# Patient Record
Sex: Male | Born: 1945 | Race: White | Hispanic: No | Marital: Married | Smoking: Current every day smoker
Health system: Southern US, Community
[De-identification: ages and names within clinical notes are randomized; demographics above are authoritative.]

## PROBLEM LIST (undated history)

## (undated) DIAGNOSIS — I219 Acute myocardial infarction, unspecified: Secondary | ICD-10-CM

## (undated) HISTORY — PX: BACK SURGERY: SHX140

## (undated) HISTORY — DX: Acute myocardial infarction, unspecified: I21.9

---

## 2018-01-30 ENCOUNTER — Encounter: Payer: Self-pay | Admitting: Internal Medicine

## 2018-01-30 ENCOUNTER — Telehealth: Payer: Self-pay | Admitting: Internal Medicine

## 2018-01-30 NOTE — Telephone Encounter (Signed)
Pt has been referred to our office for a 2nd opinion. Pt has been scheduled to see Dr. Julien Nordmann on 11/12 at 215pm. Pt is aware to arrive 30 minutes early for labs at 145pm. Letter and directions mailed to the pt.

## 2018-02-17 ENCOUNTER — Inpatient Hospital Stay: Payer: Medicare Other | Attending: Internal Medicine | Admitting: Internal Medicine

## 2018-02-17 ENCOUNTER — Other Ambulatory Visit: Payer: Self-pay | Admitting: Medical Oncology

## 2018-02-17 ENCOUNTER — Encounter: Payer: Self-pay | Admitting: Internal Medicine

## 2018-02-17 ENCOUNTER — Telehealth: Payer: Self-pay | Admitting: Internal Medicine

## 2018-02-17 ENCOUNTER — Inpatient Hospital Stay: Payer: Medicare Other

## 2018-02-17 DIAGNOSIS — Z923 Personal history of irradiation: Secondary | ICD-10-CM | POA: Insufficient documentation

## 2018-02-17 DIAGNOSIS — C3490 Malignant neoplasm of unspecified part of unspecified bronchus or lung: Secondary | ICD-10-CM

## 2018-02-17 DIAGNOSIS — R64 Cachexia: Secondary | ICD-10-CM

## 2018-02-17 DIAGNOSIS — Z85118 Personal history of other malignant neoplasm of bronchus and lung: Secondary | ICD-10-CM | POA: Insufficient documentation

## 2018-02-17 DIAGNOSIS — R918 Other nonspecific abnormal finding of lung field: Secondary | ICD-10-CM

## 2018-02-17 LAB — CBC WITH DIFFERENTIAL (CANCER CENTER ONLY)
Abs Immature Granulocytes: 0.03 10*3/uL (ref 0.00–0.07)
BASOS ABS: 0 10*3/uL (ref 0.0–0.1)
Basophils Relative: 1 %
Eosinophils Absolute: 0.1 10*3/uL (ref 0.0–0.5)
Eosinophils Relative: 1 %
HEMATOCRIT: 39.4 % (ref 39.0–52.0)
HEMOGLOBIN: 13.2 g/dL (ref 13.0–17.0)
IMMATURE GRANULOCYTES: 0 %
LYMPHS ABS: 2.1 10*3/uL (ref 0.7–4.0)
LYMPHS PCT: 23 %
MCH: 30.3 pg (ref 26.0–34.0)
MCHC: 33.5 g/dL (ref 30.0–36.0)
MCV: 90.6 fL (ref 80.0–100.0)
Monocytes Absolute: 0.8 10*3/uL (ref 0.1–1.0)
Monocytes Relative: 9 %
NEUTROS PCT: 66 %
NRBC: 0 % (ref 0.0–0.2)
Neutro Abs: 5.8 10*3/uL (ref 1.7–7.7)
Platelet Count: 173 10*3/uL (ref 150–400)
RBC: 4.35 MIL/uL (ref 4.22–5.81)
RDW: 12 % (ref 11.5–15.5)
WBC Count: 8.8 10*3/uL (ref 4.0–10.5)

## 2018-02-17 LAB — CMP (CANCER CENTER ONLY)
ALBUMIN: 3.2 g/dL — AB (ref 3.5–5.0)
ALK PHOS: 96 U/L (ref 38–126)
ALT: 10 U/L (ref 0–44)
AST: 14 U/L — AB (ref 15–41)
Anion gap: 6 (ref 5–15)
BUN: 11 mg/dL (ref 8–23)
CO2: 29 mmol/L (ref 22–32)
Calcium: 8.8 mg/dL — ABNORMAL LOW (ref 8.9–10.3)
Chloride: 104 mmol/L (ref 98–111)
Creatinine: 0.89 mg/dL (ref 0.61–1.24)
GFR, Est AFR Am: >60 mL/min (ref >60)
GFR, Estimated: >60 mL/min (ref >60)
GLUCOSE: 100 mg/dL — AB (ref 70–99)
POTASSIUM: 4.1 mmol/L (ref 3.5–5.1)
SODIUM: 139 mmol/L (ref 135–145)
Total Bilirubin: 0.4 mg/dL (ref 0.3–1.2)
Total Protein: 6.9 g/dL (ref 6.5–8.1)

## 2018-02-17 NOTE — Progress Notes (Signed)
[place] [phone]   [phone]  CONSULT NOTE  REFERRING PHYSICIAN: Dr. Thea Silversmith  REASON FOR CONSULTATION:  72 years old white male with suspicious metastatic lung cancer  HPI Charles Horne is a 72 y.o. male with past medical history significant for coronary artery disease status post myocardial infarction, history of back surgery, history of arm and knee surgery.  The patient also has a long history for smoking for over 60 years and still smoking.  He mentioned that he was seen by his primary care physician in 2016 and he underwent CT screening of the chest on Sep 01, 2014 and that showed a left upper lobe/apical pulmonary nodule that is spiculated and highly suspicious for primary bronchogenic carcinoma.  The patient also had severe centrilobular emphysema.  A PET scan on 09/14/2014 showed the solitary left upper lobe nodule concerning for malignancy.  He was seen by radiation oncology and a biopsy was not performed for concern about pneumothorax because of the significant emphysema.  He underwent stereotactic radiotherapy to the left upper lobe lung nodule under the care of Dr. Quitman Livings.  Repeat imaging studies after the procedure showed persistent the scarring and the left apical area.  The patient was followed by observation and repeat imaging studies for the last 3 years.  He was seen recently by Dr. Wilburt Finlay worse for routine follow-up visit with repeat CT scan of the chest on 12/18/2017 and that showed numerous greater than 20 new subcentimeter solid pulmonary nodules are scattered throughout both lungs and most compatible with pulmonary metastasis.  There was also stable masslike radiation fibrosis in the apical left upper lobe with no thoracic adenopathy.  Dr. Pablo Ledger discussed with the patient some of his treatment options including palliative care and hospice referral since the patient does not have any tissue diagnosis confirmed.  The  patient requested a second opinion and he was referred to me today for evaluation and recommendation regarding his condition. When seen today he is feeling fine with no concerning complaints.  He denied having any chest pain, shortness breath, cough or hemoptysis.  He denied having any recent weight loss or night sweats.  He has no nausea, vomiting, diarrhea or constipation.  He denied having any headache or visual changes. Family history significant for mother with heart disease, father died from pneumonia, 2 sisters with breast cancer. The patient is married and has 4 children.  He was accompanied by his wife Charles Horne.  The patient has a history of his smoking more than 1 pack/day for over 60 years and unfortunately he continues to smoke and I strongly encouraged him to quit smoking.  He also has a history of alcohol abuse in the past but not in the last few years.  He denied having any history of drug abuse. HPI  Past Medical History:  Diagnosis Date  . Myocardial infarction [facility])     Past Surgical History:  Procedure Laterality Date  . BACK SURGERY      Family History  Problem Relation Age of Onset  . Coronary artery disease Mother   . Pneumonia Father   . Breast cancer Sister     Social History Social History   Tobacco Use  . Smoking status: Current Every Day Smoker    Packs/day: 1.50    Years: 60.00    Pack years: 90.00    Types: Cigarettes  . Smokeless tobacco: Never Used  Substance Use Topics  . Alcohol use: Not Currently  .  Drug use: Never    Allergies  Allergen Reactions  . Demerol [Meperidine Hcl]     No current outpatient medications on file.   No current facility-administered medications for this visit.     Review of Systems  Constitutional: negative Eyes: negative Ears, nose, mouth, throat, and face: negative Respiratory: negative Cardiovascular: negative Gastrointestinal: negative Genitourinary:negative Integument/breast:  negative Hematologic/lymphatic: negative Musculoskeletal:negative Neurological: negative Behavioral/Psych: negative Endocrine: negative Allergic/Immunologic: negative  Physical Exam  BWG:YKZLD, healthy, no distress, well nourished and well developed SKIN: skin color, texture, turgor are normal, no rashes or significant lesions HEAD: Normocephalic, No masses, lesions, tenderness or abnormalities EYES: normal, PERRLA, Conjunctiva are pink and non-injected EARS: External ears normal, Canals clear OROPHARYNX:no exudate, no erythema and lips, buccal mucosa, and tongue normal  NECK: supple, no adenopathy, no JVD LYMPH:  no palpable lymphadenopathy, no hepatosplenomegaly LUNGS: clear to auscultation , and palpation HEART: regular rate & rhythm, no murmurs and no gallops ABDOMEN:abdomen soft, non-tender, normal bowel sounds and no masses or organomegaly BACK: Back symmetric, no curvature., No CVA tenderness EXTREMITIES:no joint deformities, effusion, or inflammation, no edema  NEURO: alert & oriented x 3 with fluent speech, no focal motor/sensory deficits  PERFORMANCE STATUS: ECOG 1  LABORATORY DATA: Lab Results  Component Value Date   WBC 8.8 02/17/2018   HGB 13.2 02/17/2018   HCT 39.4 02/17/2018   MCV 90.6 02/17/2018   PLT 173 02/17/2018      Chemistry   No results found for: NA, K, CL, CO2, BUN, CREATININE, GLU No results found for: CALCIUM, ALKPHOS, AST, ALT, BILITOT     RADIOGRAPHIC STUDIES: No results found.  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 He has been in observation for the last several years but the recent CT scan of the chest showed numerous bilateral pulmonary nodules suspicious for metastatic disease.  PLAN: I had a lengthy discussion with the patient and  his wife today about his current disease stage, prognosis and treatment options. I personally and independently reviewed the scan images and discussed the result and showed the images to the patient and his wife. Unfortunately the patient has no tissue diagnosis so far.  There was some concern about proceeding with CT-guided core biopsy for fear of the pneumothorax because of his significant emphysema. I recommended for the patient to have his case discussed at the weekly thoracic conference at [facility] health to find the best approach to do a biopsy for this patient either with CT-guided core biopsy of 1 of the peripheral pulmonary nodules versus consideration of bronchoscopy with navigation bronchoscopy if needed. The patient is scheduled to have repeat CT scan of the chest by Dr. Pablo Ledger next week and I will also wait for the results of this scan to find out if the patient has any other sites for tissue biopsy. I will arrange for the patient to come back for follow-up visit in 3 weeks for evaluation and more detailed discussion of his condition and best approach for biopsy as well as treatment options. The patient was strongly advised to quit smoking. He was also advised to call immediately if he has any concerning symptoms in the interval. The patient voices understanding of current disease status and treatment options and is in agreement with the current care plan.  All questions were answered. The patient  knows to call the clinic with any problems, questions or concerns. We can certainly see the patient much sooner if necessary.  Thank you so much for allowing me to participate in the care of Charles Horne. I will continue to follow up the patient with you and assist in his care.  I spent 40 minutes counseling the patient face to face. The total time spent in the appointment was 60 minutes.  Disclaimer: This note was dictated with voice recognition software. Similar sounding words can  inadvertently be transcribed and may not be corrected upon review.   Eilleen Kempf February 17, 2018, 2:30 PM

## 2018-02-17 NOTE — Telephone Encounter (Signed)
Scheduled appt per 11/12 los- gave patient AVS and calender per los.   

## 2018-02-20 ENCOUNTER — Telehealth: Payer: Self-pay | Admitting: Internal Medicine

## 2018-02-20 NOTE — Telephone Encounter (Signed)
Called about 11/25

## 2018-02-26 ENCOUNTER — Other Ambulatory Visit: Payer: Self-pay | Admitting: *Deleted

## 2018-02-26 NOTE — Progress Notes (Signed)
Cancer conference 02/26/18 discussion documented for communication purpose. Patient was not present nor seen by physician.

## 2018-03-02 ENCOUNTER — Encounter: Payer: Medicare Other | Admitting: Nutrition

## 2018-03-10 ENCOUNTER — Encounter: Payer: Self-pay | Admitting: Internal Medicine

## 2018-03-10 ENCOUNTER — Inpatient Hospital Stay: Payer: Medicare Other | Attending: Internal Medicine | Admitting: Internal Medicine

## 2018-03-10 ENCOUNTER — Telehealth: Payer: Self-pay | Admitting: Internal Medicine

## 2018-03-10 VITALS — BP 121/73 | HR 87 | Temp 98.5°F | Resp 17 | Wt 125.3 lb

## 2018-03-10 DIAGNOSIS — Z923 Personal history of irradiation: Secondary | ICD-10-CM | POA: Insufficient documentation

## 2018-03-10 DIAGNOSIS — Z79899 Other long term (current) drug therapy: Secondary | ICD-10-CM | POA: Insufficient documentation

## 2018-03-10 DIAGNOSIS — C3412 Malignant neoplasm of upper lobe, left bronchus or lung: Secondary | ICD-10-CM | POA: Diagnosis present

## 2018-03-10 DIAGNOSIS — R918 Other nonspecific abnormal finding of lung field: Secondary | ICD-10-CM

## 2018-03-10 DIAGNOSIS — C3490 Malignant neoplasm of unspecified part of unspecified bronchus or lung: Secondary | ICD-10-CM

## 2018-03-10 NOTE — Progress Notes (Signed)
[place] [phone]   [phone]  OFFICE PROGRESS NOTE  Myrtis Hopping., MD 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  PRIOR THERAPY:status post stereotactic radiotherapy at that time. He has been in observation for the last several years but the recent CT scan of the chest showed numerous bilateral pulmonary nodules suspicious for metastatic disease.  CURRENT THERAPY: Observation.  INTERVAL HISTORY: Charles Horne 72 y.o. male returns to the clinic today for returns to the clinic today for follow-up visit.  The patient is feeling fine today with no concerning complaints except for low back pain.  He has degenerative disc disease in that area.  He denied having any chest pain, shortness of breath, cough or hemoptysis.  He denied having any fever or chills.  He has no nausea, vomiting, diarrhea or constipation.  He has no weight loss or night sweats.  The patient had repeat CT scan of the chest performed at [facility] hospital yesterday and he is here for evaluation and discussion of his discuss results and recommendation regarding his condition.  MEDICAL HISTORY: Past Medical History:  Diagnosis Date  . Myocardial infarction (HCC)     ALLERGIES:  is allergic to demerol [meperidine hcl].  MEDICATIONS:  Current Outpatient Medications  Medication Sig Dispense Refill  . albuterol (PROVENTIL HFA;VENTOLIN HFA) 108 (90 Base) MCG/ACT inhaler Inhale 2 puffs into the lungs every 4 (four) hours as needed.    . calcium-vitamin D (OSCAL WITH D) 500-200 MG-UNIT TABS tablet Take 1 tablet by mouth daily.    . clonazePAM (KLONOPIN) 1 MG tablet Take 1 tablet by mouth 3 (three) times daily as needed.    . docusate sodium (COLACE) 100 MG capsule Take 1 capsule by mouth daily.    . meclizine  (ANTIVERT) 25 MG tablet Take 1 tablet by mouth as needed.    . metoprolol succinate (TOPROL-XL) 50 MG 24 hr tablet Take 1 tablet by mouth daily.    . mirtazapine (REMERON) 45 MG tablet Take 45 mg by mouth at bedtime.    . nitroGLYCERIN (NITROSTAT) 0.4 MG SL tablet Place under the tongue.    Marland Kitchen omeprazole (PRILOSEC) 40 MG capsule Take 1 capsule by mouth daily.  1  . oxyCODONE-acetaminophen (PERCOCET) 10-325 MG tablet Take 1 tablet by mouth 4 (four) times daily as needed.    . Saw Palmetto 160 MG CAPS Take 1 capsule by mouth daily. 2 in am and 1 hs    . SYMBICORT 160-4.5 MCG/ACT inhaler Inhale 2 puffs into the lungs 2 (two) times daily.  3  . TRELEGY ELLIPTA 100-62.5-25 MCG/INH AEPB Inhale 1 puff into the lungs daily.  3   No current facility-administered medications for this visit.     SURGICAL HISTORY:  Past Surgical History:  Procedure Laterality Date  . BACK SURGERY      REVIEW OF SYSTEMS:  Constitutional: positive for fatigue Eyes: negative Ears, nose, mouth, throat, and face: negative Respiratory: negative Cardiovascular: negative Gastrointestinal: negative Genitourinary:negative Integument/breast: negative Hematologic/lymphatic: negative Musculoskeletal:positive for back pain Neurological: negative Behavioral/Psych: negative Endocrine: negative Allergic/Immunologic: negative   PHYSICAL EXAMINATION: General appearance: alert, cooperative, fatigued and no distress Head: Normocephalic, without obvious abnormality, atraumatic Neck: no adenopathy, no JVD, supple, symmetrical, trachea midline and thyroid not enlarged, symmetric, no tenderness/mass/nodules Lymph nodes: Cervical, supraclavicular, and axillary nodes  normal. Resp: clear to auscultation bilaterally Back: symmetric, no curvature. ROM normal. No CVA tenderness. Cardio: regular rate and rhythm, S1, S2 normal, no murmur, click, rub or gallop GI: soft, non-tender; bowel sounds normal; no masses,  no  organomegaly Extremities: extremities normal, atraumatic, no cyanosis or edema Neurologic: Alert and oriented X 3, normal strength and tone. Normal symmetric reflexes. Normal coordination and gait  ECOG PERFORMANCE STATUS: 1 - Symptomatic but completely ambulatory  Blood pressure 121/73, pulse 87, temperature 98.5 F (36.9 C), temperature source Oral, resp. rate 17, weight 125 lb 4.8 oz (56.8 kg), SpO2 (!) 89 %.  LABORATORY DATA: Lab Results  Component Value Date   WBC 8.8 02/17/2018   HGB 13.2 02/17/2018   HCT 39.4 02/17/2018   MCV 90.6 02/17/2018   PLT 173 02/17/2018      Chemistry      Component Value Date/Time   NA 139 02/17/2018 1349   K 4.1 02/17/2018 1349   CL 104 02/17/2018 1349   CO2 29 02/17/2018 1349   BUN 11 02/17/2018 1349   CREATININE 0.89 02/17/2018 1349      Component Value Date/Time   CALCIUM 8.8 (L) 02/17/2018 1349   ALKPHOS 96 02/17/2018 1349   AST 14 (L) 02/17/2018 1349   ALT 10 02/17/2018 1349   BILITOT 0.4 02/17/2018 1349       RADIOGRAPHIC STUDIES: No results found.  ASSESSMENT AND PLAN: This is a very pleasant 72 years old white male with history of stage IA suspicious lung cancer status post stereotactic radiotherapy to left upper lobe mass at [facility] hospital and has been in observation since that time.  The patient was found to have bilateral pulmonary nodules suspicious for metastatic disease. He had repeat CT scan of the chest performed yesterday that showed mixed with finding with the metastatic pulmonary nodules some are smaller and some are larger but none of them is large enough for consideration of CT-guided core biopsy or bronchoscopy.  The patient has no evidence for mediastinal or hilar adenopathy and no evidence of metastatic disease in the upper abdomen. I personally and independently reviewed the scan images and discussed the result and showed the images to the patient and his wife. I recommended for the patient to  continue on observation for now with repeat CT scan of the chest in 3 months for restaging of his disease and to consider him for a biopsy if there is an accessible nodule for biopsy. The patient was also advised to call immediately if he has any concerning symptoms in the interval. The patient voices understanding of current disease status and treatment options and is in agreement with the current care plan.  All questions were answered. The patient knows to call the clinic with any problems, questions or concerns. We can certainly see the patient much sooner if necessary.  I spent 15 minutes counseling the patient face to face. The total time spent in the appointment was 25 minutes.  Disclaimer: This note was dictated with voice recognition software. Similar sounding words can inadvertently be transcribed and may not be corrected upon review.

## 2018-03-10 NOTE — Telephone Encounter (Signed)
Printed calendar and avs. °

## 2018-06-05 ENCOUNTER — Inpatient Hospital Stay: Payer: Medicare Other | Attending: Internal Medicine

## 2018-06-05 DIAGNOSIS — C3412 Malignant neoplasm of upper lobe, left bronchus or lung: Secondary | ICD-10-CM | POA: Diagnosis not present

## 2018-06-05 DIAGNOSIS — Z923 Personal history of irradiation: Secondary | ICD-10-CM | POA: Diagnosis not present

## 2018-06-05 DIAGNOSIS — C3490 Malignant neoplasm of unspecified part of unspecified bronchus or lung: Secondary | ICD-10-CM

## 2018-06-05 DIAGNOSIS — Z79899 Other long term (current) drug therapy: Secondary | ICD-10-CM | POA: Diagnosis not present

## 2018-06-05 LAB — CBC WITH DIFFERENTIAL (CANCER CENTER ONLY)
Abs Immature Granulocytes: 0.04 10*3/uL (ref 0.00–0.07)
Basophils Absolute: 0 10*3/uL (ref 0.0–0.1)
Basophils Relative: 0 %
EOS ABS: 0.1 10*3/uL (ref 0.0–0.5)
EOS PCT: 1 %
HEMATOCRIT: 43.2 % (ref 39.0–52.0)
HEMOGLOBIN: 14.3 g/dL (ref 13.0–17.0)
IMMATURE GRANULOCYTES: 0 %
LYMPHS ABS: 1.5 10*3/uL (ref 0.7–4.0)
LYMPHS PCT: 12 %
MCH: 30 pg (ref 26.0–34.0)
MCHC: 33.1 g/dL (ref 30.0–36.0)
MCV: 90.6 fL (ref 80.0–100.0)
MONOS PCT: 8 %
Monocytes Absolute: 1 10*3/uL (ref 0.1–1.0)
NEUTROS PCT: 79 %
Neutro Abs: 9.6 10*3/uL — ABNORMAL HIGH (ref 1.7–7.7)
Platelet Count: 150 10*3/uL (ref 150–400)
RBC: 4.77 MIL/uL (ref 4.22–5.81)
RDW: 13.5 % (ref 11.5–15.5)
WBC Count: 12.2 10*3/uL — ABNORMAL HIGH (ref 4.0–10.5)
nRBC: 0 % (ref 0.0–0.2)

## 2018-06-05 LAB — CMP (CANCER CENTER ONLY)
ALK PHOS: 112 U/L (ref 38–126)
ALT: 11 U/L (ref 0–44)
AST: 17 U/L (ref 15–41)
Albumin: 3.7 g/dL (ref 3.5–5.0)
Anion gap: 5 (ref 5–15)
BUN: 10 mg/dL (ref 8–23)
CALCIUM: 9.2 mg/dL (ref 8.9–10.3)
CO2: 32 mmol/L (ref 22–32)
Chloride: 106 mmol/L (ref 98–111)
Creatinine: 0.98 mg/dL (ref 0.61–1.24)
GFR, Est AFR Am: >60 mL/min (ref >60)
GFR, Estimated: >60 mL/min (ref >60)
Glucose, Bld: 89 mg/dL (ref 70–99)
Potassium: 4.1 mmol/L (ref 3.5–5.1)
SODIUM: 143 mmol/L (ref 135–145)
Total Bilirubin: 0.5 mg/dL (ref 0.3–1.2)
Total Protein: 6.9 g/dL (ref 6.5–8.1)

## 2018-06-10 ENCOUNTER — Ambulatory Visit (HOSPITAL_COMMUNITY)
Admission: RE | Admit: 2018-06-10 | Discharge: 2018-06-10 | Disposition: A | Discharge status: Home / Self Care | Payer: Medicare Other | Source: Ambulatory Visit | Attending: Internal Medicine | Admitting: Internal Medicine

## 2018-06-10 DIAGNOSIS — C3490 Malignant neoplasm of unspecified part of unspecified bronchus or lung: Secondary | ICD-10-CM | POA: Diagnosis not present

## 2018-06-10 MED ORDER — IOHEXOL 300 MG/ML  SOLN
75.0000 mL | Freq: Once | INTRAMUSCULAR | Status: AC | PRN
  Administered 2018-06-10: 75 mL via INTRAVENOUS

## 2018-06-10 MED ORDER — SODIUM CHLORIDE (PF) 0.9 % IJ SOLN
INTRAMUSCULAR | Status: AC
  Filled 2018-06-10: qty 50

## 2018-06-11 ENCOUNTER — Encounter: Payer: Self-pay | Admitting: Internal Medicine

## 2018-06-11 ENCOUNTER — Inpatient Hospital Stay: Payer: Medicare Other | Attending: Internal Medicine | Admitting: Internal Medicine

## 2018-06-11 VITALS — BP 120/65 | HR 87 | Temp 97.8°F | Resp 18 | Wt 129.0 lb

## 2018-06-11 DIAGNOSIS — Z923 Personal history of irradiation: Secondary | ICD-10-CM | POA: Diagnosis not present

## 2018-06-11 DIAGNOSIS — Z85118 Personal history of other malignant neoplasm of bronchus and lung: Secondary | ICD-10-CM | POA: Diagnosis present

## 2018-06-11 DIAGNOSIS — R918 Other nonspecific abnormal finding of lung field: Secondary | ICD-10-CM

## 2018-06-11 NOTE — Progress Notes (Signed)
[place] Telephone:(336) [phone]   [phone]) [phone]  OFFICE PROGRESS NOTE  Myrtis Hopping., MD [address] [place] [address]  DIAGNOSIS: stage IA  lung cancer without tissue diagnosis in June 2016.  PRIOR THERAPY:status post stereotactic radiotherapy at that time.   CURRENT THERAPY: Observation.  INTERVAL HISTORY: Charles Horne 73 y.o. male returns to the clinic today for follow-up visit accompanied by his wife.  The patient is feeling fine today with no concerning complaints except for occasional shortness of breath with exertion.  His oxygen saturation today was 100% with no home oxygen.  He denied having any chest pain, cough or hemoptysis.  He has no nausea, vomiting, diarrhea or constipation.  He was found on previous CT scan of the chest to have multiple pulmonary nodule that were concerning for metastatic disease.  It was difficult to have a biopsy from him because of his pulmonary status.  He continued on observation and he presented today for evaluation with repeat CT scan of the chest.  MEDICAL HISTORY: Past Medical History:  Diagnosis Date  . Myocardial infarction (HCC)     ALLERGIES:  is allergic to demerol [meperidine hcl].  MEDICATIONS:  Current Outpatient Medications  Medication Sig Dispense Refill  . albuterol (PROVENTIL HFA;VENTOLIN HFA) 108 (90 Base) MCG/ACT inhaler Inhale 2 puffs into the lungs every 4 (four) hours as needed.    . calcium-vitamin D (OSCAL WITH D) 500-200 MG-UNIT TABS tablet Take 1 tablet by mouth daily.    . clonazePAM (KLONOPIN) 1 MG tablet Take 1 tablet by mouth 3 (three) times daily as needed.    . docusate sodium (COLACE) 100 MG capsule Take 1 capsule by mouth daily.    . meclizine (ANTIVERT) 25 MG tablet Take 1 tablet by mouth as needed.    . metoprolol succinate (TOPROL-XL) 50 MG 24 hr tablet Take 1 tablet by mouth daily.    . mirtazapine (REMERON) 45 MG tablet Take 45 mg by mouth at  bedtime.    . nitroGLYCERIN (NITROSTAT) 0.4 MG SL tablet Place under the tongue.    Marland Kitchen omeprazole (PRILOSEC) 40 MG capsule Take 1 capsule by mouth daily.  1  . oxyCODONE-acetaminophen (PERCOCET) 10-325 MG tablet Take 1 tablet by mouth 4 (four) times daily as needed.    . Saw Palmetto 160 MG CAPS Take 1 capsule by mouth daily. 2 in am and 1 hs    . SYMBICORT 160-4.5 MCG/ACT inhaler Inhale 2 puffs into the lungs 2 (two) times daily.  3  . TRELEGY ELLIPTA 100-62.5-25 MCG/INH AEPB Inhale 1 puff into the lungs daily.  3   No current facility-administered medications for this visit.     SURGICAL HISTORY:  Past Surgical History:  Procedure Laterality Date  . BACK SURGERY      REVIEW OF SYSTEMS:  A comprehensive review of systems was negative except for: Constitutional: positive for fatigue Respiratory: positive for dyspnea on exertion   PHYSICAL EXAMINATION: General appearance: alert, cooperative, fatigued and no distress Head: Normocephalic, without obvious abnormality, atraumatic Neck: no adenopathy, no JVD, supple, symmetrical, trachea midline and thyroid not enlarged, symmetric, no tenderness/mass/nodules Lymph nodes: Cervical, supraclavicular, and axillary nodes normal. Resp: clear to auscultation bilaterally Back: symmetric, no curvature. ROM normal. No CVA tenderness. Cardio: regular rate and rhythm, S1, S2 normal, no murmur, click, rub or gallop GI: soft, non-tender; bowel sounds normal; no masses,  no organomegaly Extremities: extremities normal, atraumatic, no cyanosis or edema  ECOG PERFORMANCE STATUS: 1 - Symptomatic but completely ambulatory  Blood pressure 120/65, pulse 87, temperature 97.8 F (36.6 C), temperature source Oral, resp. rate 18, weight 129 lb (58.5 kg), SpO2 100 %.  LABORATORY DATA: Lab Results  Component Value Date   WBC 12.2 (H) 06/05/2018   HGB 14.3 06/05/2018   HCT 43.2 06/05/2018   MCV 90.6 06/05/2018   PLT 150 06/05/2018      Chemistry        Component Value Date/Time   NA 143 06/05/2018 1009   K 4.1 06/05/2018 1009   CL 106 06/05/2018 1009   CO2 32 06/05/2018 1009   BUN 10 06/05/2018 1009   CREATININE 0.98 06/05/2018 1009      Component Value Date/Time   CALCIUM 9.2 06/05/2018 1009   ALKPHOS 112 06/05/2018 1009   AST 17 06/05/2018 1009   ALT 11 06/05/2018 1009   BILITOT 0.5 06/05/2018 1009       RADIOGRAPHIC STUDIES: Ct Chest W Contrast  Result Date: 06/10/2018 CLINICAL DATA:  Lung cancer, possibly metastatic. EXAM: CT CHEST WITH CONTRAST TECHNIQUE: Multidetector CT imaging of the chest was performed during intravenous contrast administration. CONTRAST:  17mL OMNIPAQUE IOHEXOL 300 MG/ML  SOLN COMPARISON:  03/09/2018. FINDINGS: Cardiovascular: Atherosclerotic calcification of the aorta, aortic valve and coronary arteries. Heart size normal. No pericardial effusion. Mediastinum/Nodes: No pathologically enlarged mediastinal, hilar or axillary lymph nodes. Esophagus is grossly unremarkable. Lungs/Pleura: Bullous emphysema. 2.3 x 3.5 cm mass in the apical left upper lobe is unchanged. Previously seen pulmonary nodules have largely resolved in the interval with exception of a few scattered tiny pulmonary nodules measuring up to 5 mm in the right lower lobe (series 7, image 88). Scarring in the medial aspects of both lower lobes. No pleural fluid. Airway is unremarkable. Upper Abdomen: Visualized portions of the liver and adrenal glands are unremarkable. Fluid density lesions in the right kidney measure up to 2.4 cm, indicative of cysts. Visualized portions of the left kidney, spleen, pancreas, stomach and bowel are grossly unremarkable. No upper abdominal adenopathy. Musculoskeletal: Postoperative changes in thoracolumbar spine. No worrisome lytic or sclerotic lesions. IMPRESSION: 1. Near complete resolution of previously seen bilateral pulmonary nodules with a few scattered tiny residual pulmonary nodules. Continued attention on  follow-up exams is warranted. 2. Spiculated left upper lobe mass is unchanged. 3. Aortic atherosclerosis (ICD10-170.0). Coronary artery calcification. 4.  Emphysema (ICD10-J43.9). Electronically Signed   By: Lorin Picket M.D.   On: 06/10/2018 15:51    ASSESSMENT AND PLAN: This is a very pleasant 73 years old white male with history of stage IA suspicious lung cancer status post stereotactic radiotherapy to left upper lobe mass at [facility] and has been in observation since that time.  The patient was found on previous CT scan of the chest to have bilateral pulmonary nodules suspicious for metastatic disease. It was very difficult to biopsy because of his pulmonary status. He was followed by observation and the patient had repeat CT scan of the chest performed recently.  His scan showed near complete resolution of the bilateral pulmonary nodules. I personally and independently reviewed the scans and discussed the results with the patient today. I recommended for him to continue on observation with repeat CT scan of the chest in 6 months.  The patient would like his scan to be done by Dr. Pablo Ledger at [facility].  I agreed with his decision and advised the patient to call if he has any  concerning symptoms in the interval or if he needs to be seen in the future. I will see him on as-needed basis at this point. The patient voices understanding of current disease status and treatment options and is in agreement with the current care plan.  All questions were answered. The patient knows to call the clinic with any problems, questions or concerns. We can certainly see the patient much sooner if necessary.  I spent 10 minutes counseling the patient face to face. The total time spent in the appointment was 15 minutes.  Disclaimer: This note was dictated with voice recognition software. Similar sounding words can inadvertently be transcribed and may not be corrected  upon review.

## 2019-12-16 IMAGING — CT CT CHEST W/ CM
2 of 4 series · 15 of 36 positions shown, 18 images · IV contrast (OMNIPAQUE)
Comparison: 03/09/2018.

CLINICAL DATA: Lung cancer, possibly metastatic.

EXAM:
CT CHEST WITH CONTRAST
TECHNIQUE: Multidetector CT imaging of the chest was performed during
intravenous contrast administration.
CONTRAST:  75mL OMNIPAQUE IOHEXOL 300 MG/ML  SOLN

[Series 2: axial st · axial · 0.71mm/px · z∈[-459,-133]mm · 12 of 191 slices shown, 15 images]
[im 14/191  mediastinal]
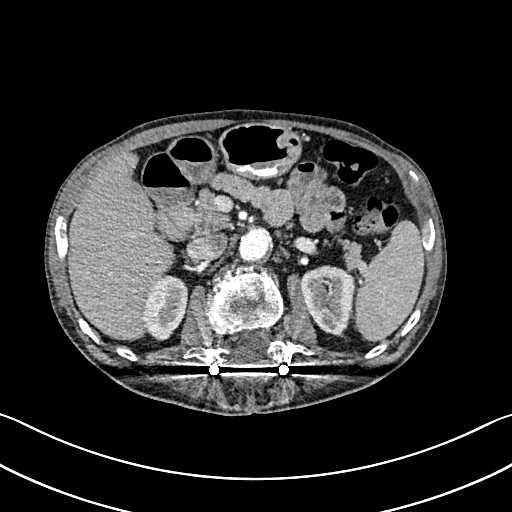
[im 14/191  lung]
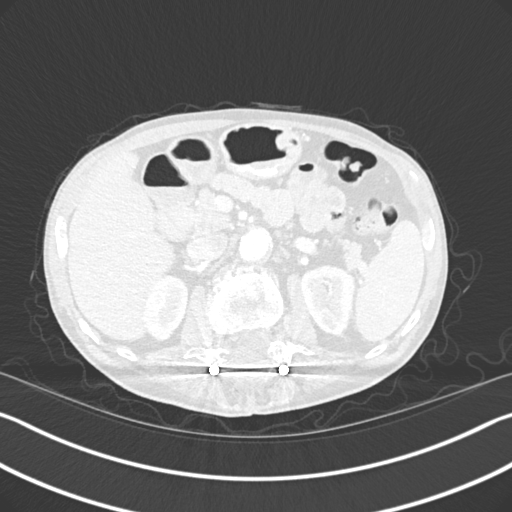
[im 28/191  lung]
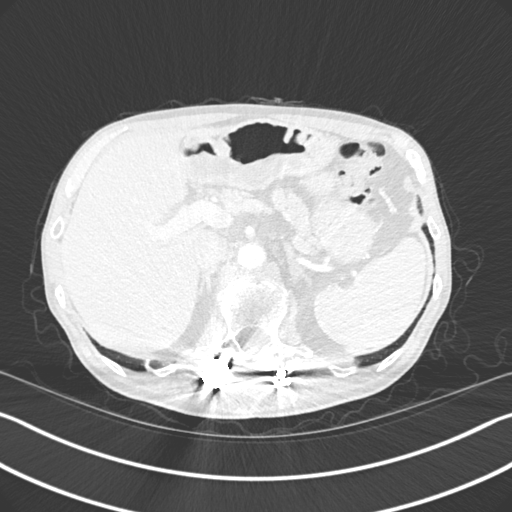
[im 41/191  lung]
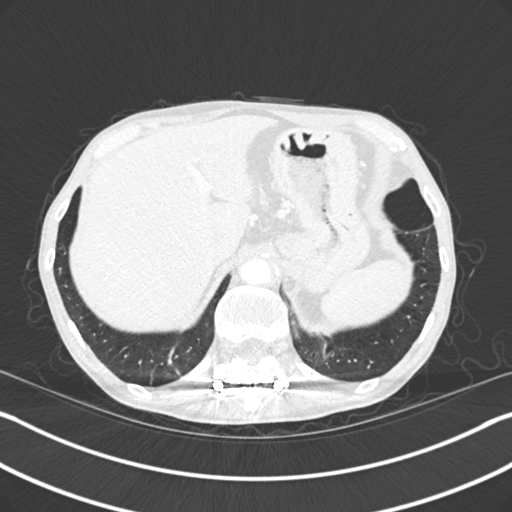
[im 55/191  lung]
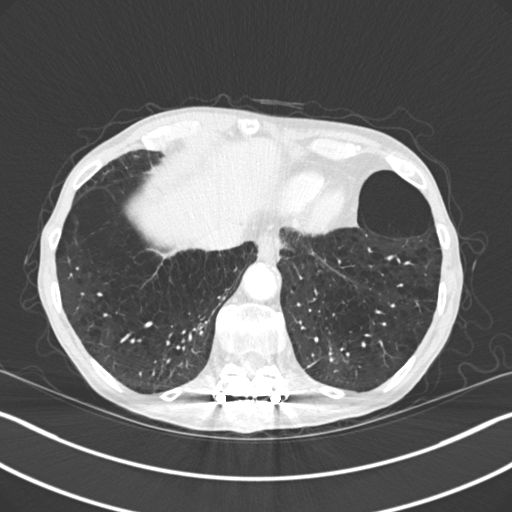
[im 68/191  mediastinal]
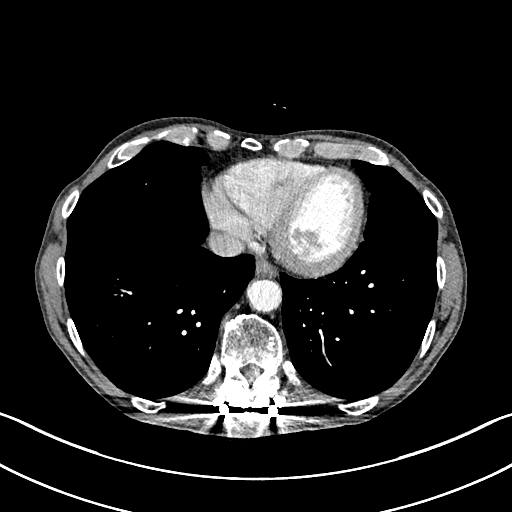
[im 68/191  lung]
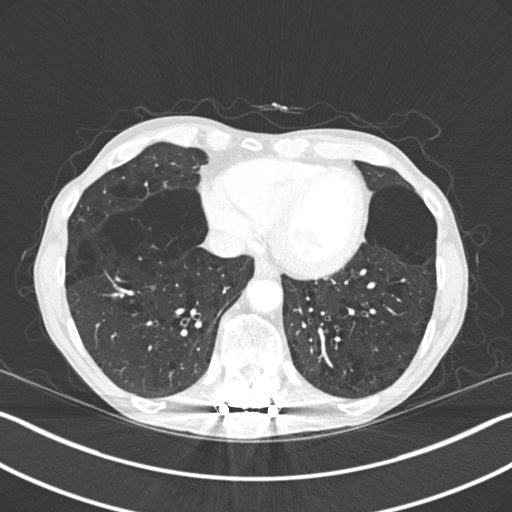
[im 82/191  lung]
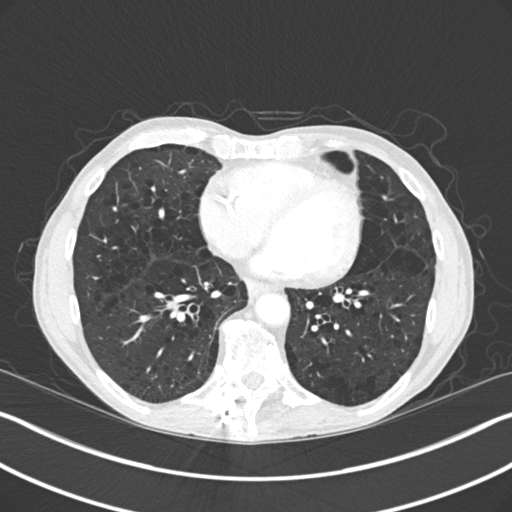
[im 109/191  lung]
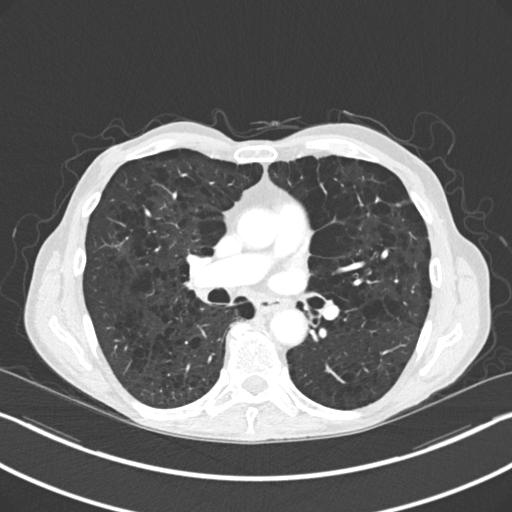
[im 123/191  lung]
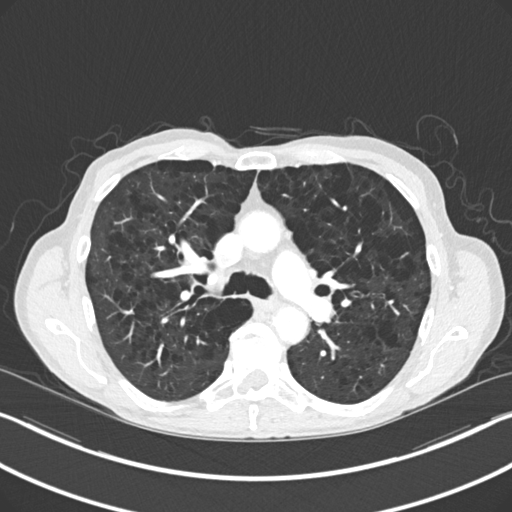
[im 136/191  mediastinal]
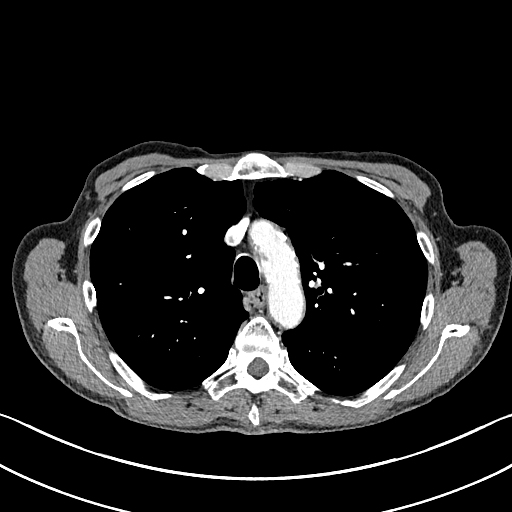
[im 136/191  lung]
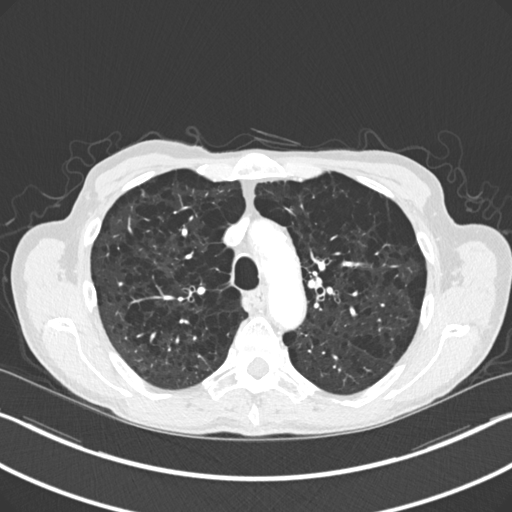
[im 150/191  lung]
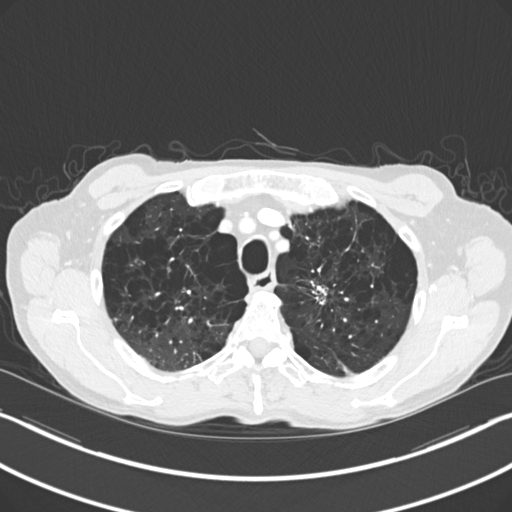
[im 163/191  lung]
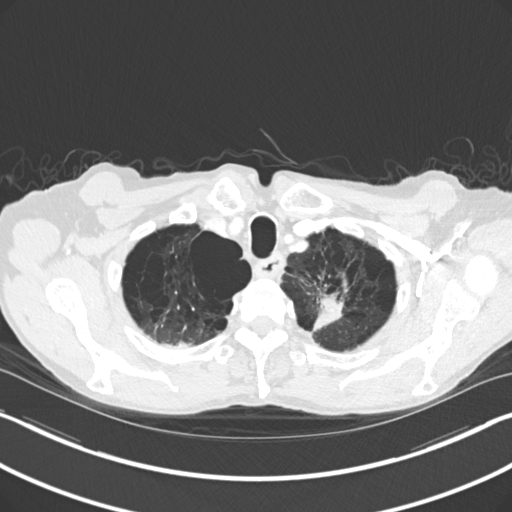
[im 177/191  lung]
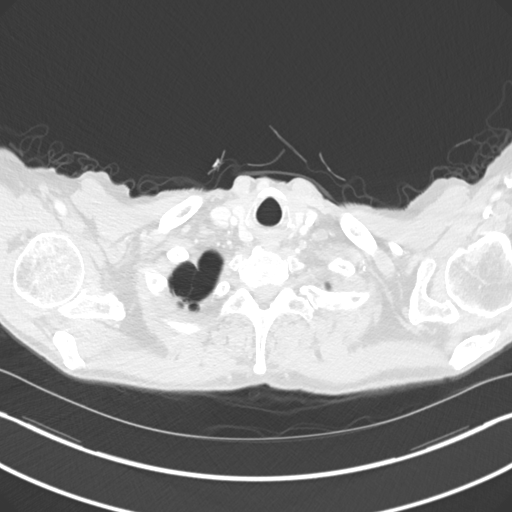

[Series 5: coronal · coronal · 0.85mm/px · 3 of 119 slices shown]
[im 24/119  lung]
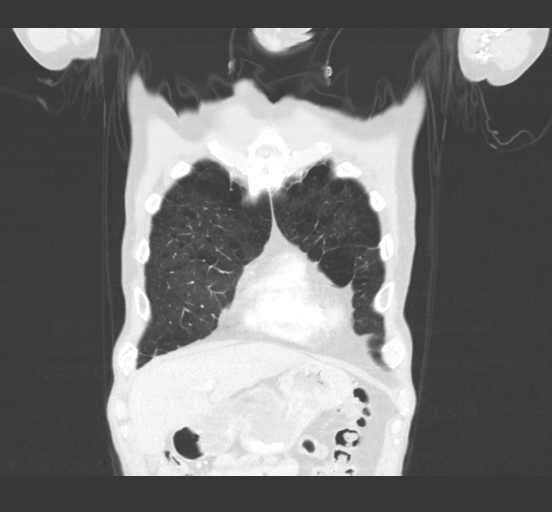
[im 48/119  lung]
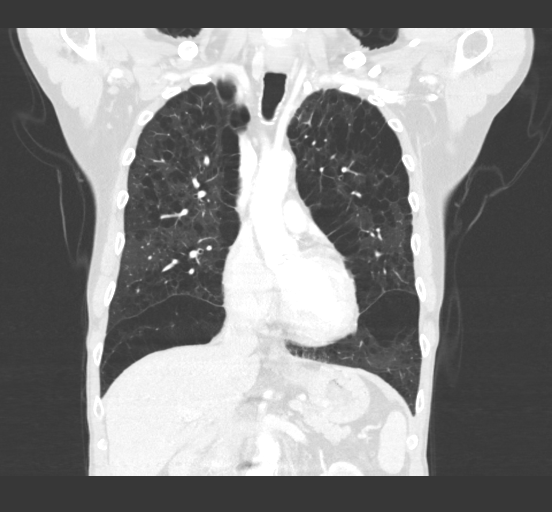
[im 71/119  lung]
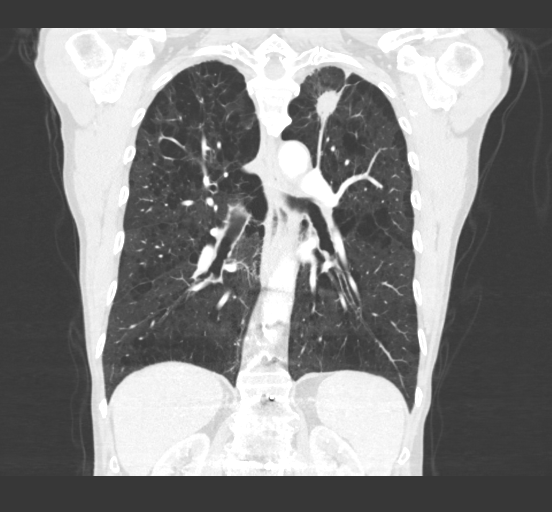

[15 of 36 positions shown; findings below may reference images not displayed]

FINDINGS: Cardiovascular: Atherosclerotic calcification of the aorta, aortic
valve and coronary arteries. Heart size normal. No pericardial
effusion.

Mediastinum/Nodes: No pathologically enlarged mediastinal, hilar or
axillary lymph nodes. Esophagus is grossly unremarkable.

Lungs/Pleura: Bullous emphysema. 2.3 x 3.5 cm mass in the apical
left upper lobe is unchanged. Previously seen pulmonary nodules have
largely resolved in the interval with exception of a few scattered
tiny pulmonary nodules measuring up to 5 mm in the right lower lobe
(series 7, image 88). Scarring in the medial aspects of both lower
lobes. No pleural fluid. Airway is unremarkable.

Upper Abdomen: Visualized portions of the liver and adrenal glands
are unremarkable. Fluid density lesions in the right kidney measure
up to 2.4 cm, indicative of cysts. Visualized portions of the left
kidney, spleen, pancreas, stomach and bowel are grossly
unremarkable. No upper abdominal adenopathy.

Musculoskeletal: Postoperative changes in thoracolumbar spine. No
worrisome lytic or sclerotic lesions.
IMPRESSION: 1. Near complete resolution of previously seen bilateral pulmonary
nodules with a few scattered tiny residual pulmonary nodules.
Continued attention on follow-up exams is warranted.
2. Spiculated left upper lobe mass is unchanged.
3. Aortic atherosclerosis (AQXH2-170.0). Coronary artery
calcification.
4.  Emphysema ([id number].N).

## 2021-08-06 DEATH — deceased
